# Patient Record
Sex: Female | Born: 1975 | Race: White | Hispanic: No | Marital: Single | Smoking: Former smoker
Health system: Southern US, Community
[De-identification: ages and names within clinical notes are randomized; demographics above are authoritative.]

## PROBLEM LIST (undated history)

## (undated) DIAGNOSIS — E039 Hypothyroidism, unspecified: Secondary | ICD-10-CM

## (undated) DIAGNOSIS — R0602 Shortness of breath: Secondary | ICD-10-CM

## (undated) DIAGNOSIS — F419 Anxiety disorder, unspecified: Secondary | ICD-10-CM

## (undated) DIAGNOSIS — I10 Essential (primary) hypertension: Secondary | ICD-10-CM

## (undated) HISTORY — PX: NO PAST SURGERIES: SHX2092

---

## 2001-05-25 ENCOUNTER — Other Ambulatory Visit: Payer: Self-pay | Admitting: Physician Assistant

## 2002-02-22 ENCOUNTER — Other Ambulatory Visit: Admission: RE | Admit: 2002-02-22 | Discharge: 2002-02-22 | Payer: Self-pay | Admitting: Obstetrics & Gynecology

## 2003-04-25 ENCOUNTER — Other Ambulatory Visit: Admission: RE | Admit: 2003-04-25 | Discharge: 2003-04-25 | Payer: Self-pay | Admitting: Obstetrics & Gynecology

## 2012-11-04 ENCOUNTER — Other Ambulatory Visit: Payer: Self-pay | Admitting: Endocrinology

## 2012-11-04 DIAGNOSIS — E041 Nontoxic single thyroid nodule: Secondary | ICD-10-CM

## 2012-11-06 ENCOUNTER — Ambulatory Visit
Admission: RE | Admit: 2012-11-06 | Discharge: 2012-11-06 | Disposition: A | Discharge status: Home / Self Care | Payer: 59 | Source: Ambulatory Visit | Attending: Endocrinology | Admitting: Endocrinology

## 2012-11-06 DIAGNOSIS — E041 Nontoxic single thyroid nodule: Secondary | ICD-10-CM

## 2012-11-09 ENCOUNTER — Other Ambulatory Visit: Payer: Self-pay | Admitting: Endocrinology

## 2012-11-09 DIAGNOSIS — E041 Nontoxic single thyroid nodule: Secondary | ICD-10-CM

## 2012-11-12 ENCOUNTER — Other Ambulatory Visit (HOSPITAL_COMMUNITY)
Admission: RE | Admit: 2012-11-12 | Discharge: 2012-11-12 | Disposition: A | Discharge status: Home / Self Care | Payer: 59 | Source: Ambulatory Visit | Attending: Interventional Radiology | Admitting: Interventional Radiology

## 2012-11-12 ENCOUNTER — Ambulatory Visit
Admission: RE | Admit: 2012-11-12 | Discharge: 2012-11-12 | Disposition: A | Discharge status: Home / Self Care | Payer: 59 | Source: Ambulatory Visit | Attending: Endocrinology | Admitting: Endocrinology

## 2012-11-12 ENCOUNTER — Other Ambulatory Visit: Payer: Self-pay

## 2012-11-12 DIAGNOSIS — E049 Nontoxic goiter, unspecified: Secondary | ICD-10-CM | POA: Insufficient documentation

## 2012-11-12 DIAGNOSIS — E041 Nontoxic single thyroid nodule: Secondary | ICD-10-CM

## 2012-11-24 ENCOUNTER — Other Ambulatory Visit: Payer: Self-pay | Admitting: Endocrinology

## 2012-11-24 DIAGNOSIS — E041 Nontoxic single thyroid nodule: Secondary | ICD-10-CM

## 2012-12-28 ENCOUNTER — Ambulatory Visit (HOSPITAL_BASED_OUTPATIENT_CLINIC_OR_DEPARTMENT_OTHER): Payer: 59 | Admitting: Genetic Counselor

## 2012-12-28 ENCOUNTER — Other Ambulatory Visit: Payer: 59

## 2012-12-28 DIAGNOSIS — IMO0002 Reserved for concepts with insufficient information to code with codable children: Secondary | ICD-10-CM

## 2012-12-28 DIAGNOSIS — Z801 Family history of malignant neoplasm of trachea, bronchus and lung: Secondary | ICD-10-CM

## 2012-12-28 DIAGNOSIS — Z803 Family history of malignant neoplasm of breast: Secondary | ICD-10-CM

## 2012-12-28 DIAGNOSIS — Z808 Family history of malignant neoplasm of other organs or systems: Secondary | ICD-10-CM

## 2012-12-29 ENCOUNTER — Encounter: Payer: Self-pay | Admitting: Genetic Counselor

## 2012-12-29 NOTE — Progress Notes (Signed)
Dr.  Viviann Spare requested a consultation for genetic counseling and risk assessment for Laura Kaiser, a 37 y.o. female, for discussion of her family history of breast, bone and lung cancer. She presents to clinic today to discuss the possibility of a genetic predisposition to cancer, and to further clarify her risks, as well as her family members' risks for cancer.   HISTORY OF PRESENT ILLNESS: Laura Kaiser is a 37 y.o. female with no personal history of cancer.    History reviewed. No pertinent past medical history.  History reviewed. No pertinent past surgical history.  History  Substance Use Topics  . Smoking status: Former Smoker -- 2 years  . Smokeless tobacco: Not on file  . Alcohol Use: Yes     Comment: rarely    REPRODUCTIVE HISTORY AND PERSONAL RISK ASSESSMENT FACTORS: Menarche was at age 84.   Premenopausal Uterus Intact: Yes Ovaries Intact: Yes [id number] , first live birth at age N/A  She has not previously undergone treatment for infertility.   Never used OCPs   She has used HRT in the past.  Is currently on Promera  FAMILY HISTORY:  We obtained a detailed, 4-generation family history.  Significant diagnoses are listed below: Family History  Problem Relation Age of Onset  . Breast cancer Mother 80  . Breast cancer Maternal Grandmother 76    also dx in her 45s and again at 69  . Bone cancer Maternal Grandfather   . Lung cancer Paternal Grandfather   . Breast cancer Cousin 59    maternal cousin  The patient's mother was diagnosed with breast cancer at age 56.  She has a brother and a sister who are healthy, but the brother's daughter was diagnosed with breast cancer at age 56.  The patient's maternal grandmother was diagnosed with breast cancer at ages 29, in her 44s and again at 52.  Her grandfather was diagnosed with bone cancer, and her paternal grandfather, who was a smoker, was diagnosed with lung cancer adn died at 37.  There is other reported cancer  history on either side of the family.  Patient's maternal ancestors are of [place] and [place] descent, and paternal ancestors are of [place] and [place] descent. There is no reported Ashkenazi Jewish ancestry. There is no  known consanguinity.  GENETIC COUNSELING RISK ASSESSMENT, DISCUSSION, AND SUGGESTED FOLLOW UP: We reviewed the natural history and genetic etiology of sporadic, familial and hereditary cancer syndromes.  About 5-10% of breast cancer is hereditary.  Of this, about 85% is the result of a BRCA1 or BRCA2 mutation.  We reviewed the red flags of hereditary cancer syndromes and the dominant inheritance patterns.  If the BRCA testing is negative, we discussed that we could be testing for the wrong gene.  We discussed gene panels, and that several cancer genes that are associated with different cancers can be tested at the same time. We discussed that testing someone who has not had cancer is not the most informative for the family.  If the patient is negative, her mother and especially her cousin, should get genetic testing.   The patient's family history of breast cancer is suggestive of the following possible diagnosis: hereditary cancer syndrome  We discussed that identification of a hereditary cancer syndrome may help her care providers tailor the patients medical management. If a mutation indicating a hereditary cancer syndrome is detected in this case, the [employer] recommendations would include increased cancer surveillance and possible prophylactic surgery.  If a mutation is detected, the patient will be referred back to the referring provider and to any additional appropriate care providers to discuss the relevant options.   If a mutation is not found in the patient, cancer surveillance options would be discussed for the patient according to the appropriate standard National Comprehensive Cancer Network and American Cancer Society guidelines, with consideration  of their personal and family history risk factors. In this case, the patient will be referred back to their care providers for discussions of management.   In order to estimate her chance of having a BRCA mutation, we used statistical models (Penn II and tyrer Cusik) and laboratory data that take into account her personal medical history, family history and ancestry.  Because each model is different, there can be a lot of variability in the risks they give.  Therefore, these numbers must be considered a rough range and not a precise risk of having a BRCA mutation.  These models estimate that she has approximately a 4.74-13% chance of having a mutation. Based on this assessment of her family and personal history, genetic testing is recommended.  Based on the patient's personal and family history, statistical models (Tyrer Cusik)  and literature data were used to estimate her risk of developing breast cancer. These estimate her lifetime risk of developing breast cancer to be approximately 39.5%. This estimation does not take into account any genetic testing results.   After considering the risks, benefits, and limitations, the patient provided informed consent for  the following  testing: Breast/Ovarian Cancer Panle through [facility].   Per the patient's request, we will contact her by telephone to discuss these results. A follow up genetic counseling visit will be scheduled if indicated.  The patient was seen for a total of 60 minutes, greater than 50% of which was spent face-to-face counseling.  This plan is being carried out per Dr. Viviann Spare recommendations.  This note will also be sent to the referring provider via the electronic medical record. The patient will be supplied with a summary of this genetic counseling discussion as well as educational information on the discussed hereditary cancer syndromes following the conclusion of their visit.   Patient was discussed with Dr. Drue Second.   _______________________________________________________________________ For Office Staff:  Number of people involved in session: 3 Was an Intern/ student involved with case: yes }

## 2013-01-28 ENCOUNTER — Other Ambulatory Visit: Payer: 59 | Admitting: Lab

## 2013-02-12 ENCOUNTER — Encounter (HOSPITAL_COMMUNITY): Payer: Self-pay | Admitting: Pharmacist

## 2013-02-12 ENCOUNTER — Other Ambulatory Visit: Payer: Self-pay | Admitting: Obstetrics

## 2013-02-16 ENCOUNTER — Encounter (HOSPITAL_COMMUNITY)
Admission: RE | Admit: 2013-02-16 | Discharge: 2013-02-16 | Disposition: A | Discharge status: Home / Self Care | Payer: 59 | Source: Ambulatory Visit | Attending: Obstetrics | Admitting: Obstetrics

## 2013-02-16 ENCOUNTER — Encounter (HOSPITAL_COMMUNITY): Payer: Self-pay

## 2013-02-16 HISTORY — DX: Shortness of breath: R06.02

## 2013-02-16 HISTORY — DX: Essential (primary) hypertension: I10

## 2013-02-16 HISTORY — DX: Anxiety disorder, unspecified: F41.9

## 2013-02-16 HISTORY — DX: Hypothyroidism, unspecified: E03.9

## 2013-02-16 LAB — CBC
Hemoglobin: 11.3 g/dL — ABNORMAL LOW (ref 12.0–15.0)
MCHC: 33 g/dL (ref 30.0–36.0)
Platelets: 213 10*3/uL (ref 150–400)
RBC: 4.19 MIL/uL (ref 3.87–5.11)

## 2013-02-16 LAB — BASIC METABOLIC PANEL
CO2: 28 mEq/L (ref 19–32)
GFR calc non Af Amer: >90 mL/min (ref >90)
Glucose, Bld: 98 mg/dL (ref 70–99)
Potassium: 3.7 mEq/L (ref 3.5–5.1)
Sodium: 138 mEq/L (ref 135–145)

## 2013-02-16 NOTE — Patient Instructions (Signed)
Your procedure is scheduled on:02/18/13  Enter through the Main Entrance at :0945 am Pick up desk phone and dial [phone] and inform us of your arrival.  Please call [phone] if you have any problems the morning of surgery.  Remember: Do not eat after midnight:WED   Take these meds the morning of surgery with a sip of water:Thyroid and BP pill   DO NOT wear jewelry, eye make-up, lipstick,body lotion, or dark fingernail polish.  Patients discharged on the day of surgery will not be allowed to drive home.

## 2013-02-17 MED ORDER — DOXYCYCLINE HYCLATE 100 MG IV SOLR
100.0000 mg | Freq: Two times a day (BID) | INTRAVENOUS | Status: DC
  Administered 2013-02-18: 100 mg via INTRAVENOUS
  Filled 2013-02-17 (×4): qty 100

## 2013-02-18 ENCOUNTER — Encounter (HOSPITAL_COMMUNITY): Payer: Self-pay | Admitting: Anesthesiology

## 2013-02-18 ENCOUNTER — Ambulatory Visit (HOSPITAL_COMMUNITY): Payer: 59 | Admitting: Anesthesiology

## 2013-02-18 ENCOUNTER — Encounter (HOSPITAL_COMMUNITY)
Admission: RE | Disposition: A | Discharge status: Home / Self Care | Payer: Self-pay | Source: Ambulatory Visit | Attending: Obstetrics

## 2013-02-18 ENCOUNTER — Ambulatory Visit (HOSPITAL_COMMUNITY)
Admission: RE | Admit: 2013-02-18 | Discharge: 2013-02-18 | Disposition: A | Discharge status: Home / Self Care | Payer: 59 | Source: Ambulatory Visit | Attending: Obstetrics | Admitting: Obstetrics

## 2013-02-18 DIAGNOSIS — N920 Excessive and frequent menstruation with regular cycle: Secondary | ICD-10-CM | POA: Insufficient documentation

## 2013-02-18 DIAGNOSIS — N8501 Benign endometrial hyperplasia: Secondary | ICD-10-CM | POA: Insufficient documentation

## 2013-02-18 DIAGNOSIS — R9389 Abnormal findings on diagnostic imaging of other specified body structures: Secondary | ICD-10-CM | POA: Insufficient documentation

## 2013-02-18 HISTORY — PX: HYSTEROSCOPY W/D&C: SHX1775

## 2013-02-18 LAB — URINALYSIS, ROUTINE W REFLEX MICROSCOPIC
Glucose, UA: NEGATIVE mg/dL
Nitrite: NEGATIVE
Protein, ur: NEGATIVE mg/dL
Urobilinogen, UA: 0.2 mg/dL (ref 0.0–1.0)

## 2013-02-18 LAB — PREGNANCY, URINE: Preg Test, Ur: NEGATIVE

## 2013-02-18 LAB — URINE MICROSCOPIC-ADD ON

## 2013-02-18 SURGERY — DILATATION AND CURETTAGE /HYSTEROSCOPY
Anesthesia: General | Site: Vagina | Wound class: Clean Contaminated

## 2013-02-18 MED ORDER — IBUPROFEN 200 MG PO TABS: 600.0000 mg | ORAL_TABLET | Freq: Four times a day (QID) | ORAL | Status: DC | PRN

## 2013-02-18 MED ORDER — LIDOCAINE HCL (CARDIAC) 20 MG/ML IV SOLN
INTRAVENOUS | Status: AC
  Filled 2013-02-18: qty 5

## 2013-02-18 MED ORDER — SILVER NITRATE-POT NITRATE 75-25 % EX MISC
CUTANEOUS | Status: AC
  Filled 2013-02-18: qty 1

## 2013-02-18 MED ORDER — OXYCODONE-ACETAMINOPHEN 5-325 MG PO TABS
2.0000 | ORAL_TABLET | ORAL | Status: DC | PRN
Start: 2013-02-18 — End: 2013-02-18

## 2013-02-18 MED ORDER — PROMETHAZINE HCL 25 MG/ML IJ SOLN: 6.2500 mg | INTRAMUSCULAR | Status: DC | PRN

## 2013-02-18 MED ORDER — KETOROLAC TROMETHAMINE 30 MG/ML IJ SOLN
INTRAMUSCULAR | Status: AC
  Filled 2013-02-18: qty 1

## 2013-02-18 MED ORDER — BUPIVACAINE HCL (PF) 0.25 % IJ SOLN
INTRAMUSCULAR | Status: AC
  Filled 2013-02-18: qty 30

## 2013-02-18 MED ORDER — KETOROLAC TROMETHAMINE 30 MG/ML IJ SOLN: 15.0000 mg | Freq: Once | INTRAMUSCULAR | Status: DC | PRN

## 2013-02-18 MED ORDER — FENTANYL CITRATE 0.05 MG/ML IJ SOLN
INTRAMUSCULAR | Status: DC | PRN
  Administered 2013-02-18: 100 ug via INTRAVENOUS

## 2013-02-18 MED ORDER — FENTANYL CITRATE 0.05 MG/ML IJ SOLN
INTRAMUSCULAR | Status: AC
  Filled 2013-02-18: qty 2

## 2013-02-18 MED ORDER — BACITRACIN-NEOMYCIN-POLYMYXIN 400-5-5000 EX OINT
TOPICAL_OINTMENT | CUTANEOUS | Status: AC
  Filled 2013-02-18: qty 1

## 2013-02-18 MED ORDER — PROPOFOL 10 MG/ML IV EMUL
INTRAVENOUS | Status: DC | PRN
  Administered 2013-02-18: 180 mg via INTRAVENOUS

## 2013-02-18 MED ORDER — MIDAZOLAM HCL 5 MG/5ML IJ SOLN
INTRAMUSCULAR | Status: DC | PRN
  Administered 2013-02-18: 2 mg via INTRAVENOUS

## 2013-02-18 MED ORDER — LIDOCAINE HCL (CARDIAC) 20 MG/ML IV SOLN
INTRAVENOUS | Status: DC | PRN
  Administered 2013-02-18: 50 mg via INTRAVENOUS

## 2013-02-18 MED ORDER — SODIUM CHLORIDE 0.9 % IR SOLN
Status: DC | PRN
  Administered 2013-02-18: 3000 mL

## 2013-02-18 MED ORDER — KETOROLAC TROMETHAMINE 30 MG/ML IJ SOLN
INTRAMUSCULAR | Status: DC | PRN
  Administered 2013-02-18: 30 mg via INTRAVENOUS

## 2013-02-18 MED ORDER — CHLOROPROCAINE HCL 1 % IJ SOLN
INTRAMUSCULAR | Status: DC | PRN
  Administered 2013-02-18: 20 mL

## 2013-02-18 MED ORDER — MEPERIDINE HCL 25 MG/ML IJ SOLN: 6.2500 mg | INTRAMUSCULAR | Status: DC | PRN

## 2013-02-18 MED ORDER — ONDANSETRON HCL 4 MG/2ML IJ SOLN
INTRAMUSCULAR | Status: DC | PRN
  Administered 2013-02-18: 4 mg via INTRAVENOUS

## 2013-02-18 MED ORDER — PROPOFOL 10 MG/ML IV EMUL
INTRAVENOUS | Status: AC
  Filled 2013-02-18: qty 20

## 2013-02-18 MED ORDER — PHENYLEPHRINE 40 MCG/ML (10ML) SYRINGE FOR IV PUSH (FOR BLOOD PRESSURE SUPPORT)
PREFILLED_SYRINGE | INTRAVENOUS | Status: AC
  Filled 2013-02-18: qty 5

## 2013-02-18 MED ORDER — DEXAMETHASONE SODIUM PHOSPHATE 10 MG/ML IJ SOLN
INTRAMUSCULAR | Status: AC
  Filled 2013-02-18: qty 1

## 2013-02-18 MED ORDER — GLYCINE 1.5 % IR SOLN
Status: DC | PRN
  Administered 2013-02-18: 3000 mL

## 2013-02-18 MED ORDER — PHENYLEPHRINE HCL 10 MG/ML IJ SOLN
INTRAMUSCULAR | Status: DC | PRN
  Administered 2013-02-18: 80 ug via INTRAVENOUS
  Administered 2013-02-18 (×2): 40 ug via INTRAVENOUS

## 2013-02-18 MED ORDER — DEXAMETHASONE SODIUM PHOSPHATE 10 MG/ML IJ SOLN
INTRAMUSCULAR | Status: DC | PRN
  Administered 2013-02-18: 10 mg via INTRAVENOUS

## 2013-02-18 MED ORDER — LACTATED RINGERS IV SOLN
INTRAVENOUS | Status: DC
Start: 2013-02-18 — End: 2013-02-18
  Administered 2013-02-18 (×2): via INTRAVENOUS
  Administered 2013-02-18: 50 mL/h via INTRAVENOUS
  Administered 2013-02-18: 10:00:00 via INTRAVENOUS

## 2013-02-18 MED ORDER — CHLOROPROCAINE HCL 1 % IJ SOLN
INTRAMUSCULAR | Status: AC
  Filled 2013-02-18: qty 30

## 2013-02-18 MED ORDER — OXYCODONE-ACETAMINOPHEN 5-325 MG PO TABS: 2.0000 | ORAL_TABLET | Freq: Four times a day (QID) | ORAL | Status: DC | PRN

## 2013-02-18 MED ORDER — FENTANYL CITRATE 0.05 MG/ML IJ SOLN: 25.0000 ug | INTRAMUSCULAR | Status: DC | PRN

## 2013-02-18 MED ORDER — MIDAZOLAM HCL 2 MG/2ML IJ SOLN
INTRAMUSCULAR | Status: AC
  Filled 2013-02-18: qty 2

## 2013-02-18 MED ORDER — MIDAZOLAM HCL 2 MG/2ML IJ SOLN: 0.5000 mg | Freq: Once | INTRAMUSCULAR | Status: DC | PRN

## 2013-02-18 MED ORDER — ONDANSETRON HCL 4 MG/2ML IJ SOLN
INTRAMUSCULAR | Status: AC
  Filled 2013-02-18: qty 2

## 2013-02-18 SURGICAL SUPPLY — 16 items
CANISTER SUCTION 2500CC (MISCELLANEOUS) ×2 IMPLANT
CATH ROBINSON RED A/P 16FR (CATHETERS) ×2 IMPLANT
CLOTH BEACON ORANGE TIMEOUT ST (SAFETY) ×2 IMPLANT
CONTAINER PREFILL 10% NBF 60ML (FORM) ×4 IMPLANT
DRESSING TELFA 8X3 (GAUZE/BANDAGES/DRESSINGS) ×2 IMPLANT
ELECT REM PT RETURN 9FT ADLT (ELECTROSURGICAL) ×2
ELECTRODE REM PT RTRN 9FT ADLT (ELECTROSURGICAL) ×1 IMPLANT
GLOVE BIO SURGEON STRL SZ 6.5 (GLOVE) ×2 IMPLANT
GLOVE BIOGEL PI IND STRL 7.0 (GLOVE) ×1 IMPLANT
GLOVE BIOGEL PI INDICATOR 7.0 (GLOVE) ×1
GOWN STRL REIN XL XLG (GOWN DISPOSABLE) ×4 IMPLANT
LOOP ANGLED CUTTING 22FR (CUTTING LOOP) IMPLANT
PACK HYSTEROSCOPY LF (CUSTOM PROCEDURE TRAY) ×2 IMPLANT
PAD OB MATERNITY 4.3X12.25 (PERSONAL CARE ITEMS) ×2 IMPLANT
TOWEL OR 17X24 6PK STRL BLUE (TOWEL DISPOSABLE) ×4 IMPLANT
WATER STERILE IRR 1000ML POUR (IV SOLUTION) ×2 IMPLANT

## 2013-02-18 NOTE — Op Note (Signed)
02/18/2013  1:30 PM  PATIENT:  Laura Kaiser  37 y.o. female  PRE-OPERATIVE DIAGNOSIS:  Endometrial Hyperplasia, Menorrhagia  [id number]  POST-OPERATIVE DIAGNOSIS:  endometrial hyperplasia, complex, menorrhagia  PROCEDURE:  Procedure(s): DILATATION AND CURETTAGE /HYSTEROSCOPY (N/A)  SURGEON:  Surgeon(s) and Role:    Tresa Endo A. Ernestina Penna, MD - Primary  PHYSICIAN ASSISTANT: none  ASSISTANTS: none   ANESTHESIA:   local and general Abx: 100mg  IV doxycycline Findings: b/l ostia, thickened endometrium, hemostasis post-procedure, 11 cm uterine sound.   EBL:  Total I/O In: 800 [I.V.:800] Out: 250 [Urine:250]  BLOOD ADMINISTERED:none  DRAINS: none   LOCAL MEDICATIONS USED:  MARCAINE     SPECIMEN:  Source of Specimen:  endometrial currettings  DISPOSITION OF SPECIMEN:  PATHOLOGY  COUNTS:  YES  TOURNIQUET:  * No tourniquets in log *  DICTATION: .Note written in EPIC  PLAN OF CARE: Discharge to home after PACU  PATIENT DISPOSITION:  PACU - hemodynamically stable.   Delay start of Pharmacological VTE agent (>24hrs) due to surgical blood loss or risk of bleeding: yes  Indications: complex hyperplasia with continued bleeding, endometrial thickening   After informed consent including discussion of risks of bleeding, infection,anasthetic complications, the patient was taken to the operating room where general anesthesia was initiated without difficulty. She was prepped and draped in normal sterile fashion in the dorsal supine lithotomy position. A straight cath was done for 250cc urine. A bimanual examination was done to assess the size and position of the uterus. A speculum was placed in the vagina and 20 cc 1/2% marcaine was used at 5 and  7 o'clock in the cervico-vaginal junction. Uterus sounded to 11 mm.  A single-tooth tenaculum was used to grasp the anterior lip of the cervix and the small hysteroscope was inserted into the uterus. Evaluation of the cavity revealed nl ostea b/l and  thickened endometrium throughout. No focal defect noted and no cancerous mass. Sharp currettage was carried out with removal of significant hypertrophic endometrium. Repeat hysteroscopy was done to confirm no uterine perforation and no masses. Fluffy endometrium noted, no bleeding.  The hysteroscope was then removed. Tenaculum was removed. The tenaculum site was hemostatic and the case was terminated.

## 2013-02-18 NOTE — Brief Op Note (Signed)
02/18/2013  1:30 PM  PATIENT:  Laura Kaiser  37 y.o. female  PRE-OPERATIVE DIAGNOSIS:  Endometrial Hyperplasia, Menorrhagia  [id number]  POST-OPERATIVE DIAGNOSIS:  endometrial hyperplasia, complex, menorrhagia  PROCEDURE:  Procedure(s): DILATATION AND CURETTAGE /HYSTEROSCOPY (N/A)  SURGEON:  Surgeon(s) and Role:    Tresa Endo A. Ernestina Penna, MD - Primary  PHYSICIAN ASSISTANT: none  ASSISTANTS: none   ANESTHESIA:   local and general  EBL:  Total I/O In: 800 [I.V.:800] Out: 250 [Urine:250]  BLOOD ADMINISTERED:none  DRAINS: none   LOCAL MEDICATIONS USED:  MARCAINE     SPECIMEN:  Source of Specimen:  endometrial currettings  DISPOSITION OF SPECIMEN:  PATHOLOGY  COUNTS:  YES  TOURNIQUET:  * No tourniquets in log *  DICTATION: .Note written in EPIC  PLAN OF CARE: Discharge to home after PACU  PATIENT DISPOSITION:  PACU - hemodynamically stable.   Delay start of Pharmacological VTE agent (>24hrs) due to surgical blood loss or risk of bleeding: yes

## 2013-02-18 NOTE — Anesthesia Preprocedure Evaluation (Signed)
Anesthesia Evaluation  Patient identified by MRN, date of birth, ID band Patient awake    Reviewed: Allergy & Precautions, H&P , Patient's Chart, lab work & pertinent test results, reviewed documented beta blocker date and time   History of Anesthesia Complications Negative for: history of anesthetic complications  Airway Mallampati: III TM Distance: >3 FB Neck ROM: full    Dental no notable dental hx.    Pulmonary neg pulmonary ROS, shortness of breath,  breath sounds clear to auscultation  Pulmonary exam normal       Cardiovascular Exercise Tolerance: Good hypertension, negative cardio ROS  Rhythm:regular Rate:Normal     Neuro/Psych PSYCHIATRIC DISORDERS Anxiety negative neurological ROS  negative psych ROS   GI/Hepatic negative GI ROS, Neg liver ROS,   Endo/Other  negative endocrine ROSHypothyroidism Morbid obesity  Renal/GU negative Renal ROS     Musculoskeletal   Abdominal   Peds  Hematology negative hematology ROS (+)   Anesthesia Other Findings   Reproductive/Obstetrics negative OB ROS                           Anesthesia Physical Anesthesia Plan  ASA: III  Anesthesia Plan: General LMA   Post-op Pain Management:    Induction:   Airway Management Planned:   Additional Equipment:   Intra-op Plan:   Post-operative Plan:   Informed Consent: I have reviewed the patients History and Physical, chart, labs and discussed the procedure including the risks, benefits and alternatives for the proposed anesthesia with the patient or authorized representative who has indicated his/her understanding and acceptance.   Dental Advisory Given  Plan Discussed with: CRNA, Surgeon and Anesthesiologist  Anesthesia Plan Comments:         Anesthesia Quick Evaluation

## 2013-02-18 NOTE — Transfer of Care (Signed)
Immediate Anesthesia Transfer of Care Note  Patient: Laura Kaiser  Procedure(s) Performed: Procedure(s): DILATATION AND CURETTAGE /HYSTEROSCOPY (N/A)  Patient Location: PACU  Anesthesia Type:General  Level of Consciousness: awake  Airway & Oxygen Therapy: Patient Spontanous Breathing  Post-op Assessment: Report given to PACU RN  Post vital signs: stable  Filed Vitals:   02/18/13 1003  BP: 136/90  Pulse: 120  Temp: 36.7 C  Resp: 18    Complications: No apparent anesthesia complications

## 2013-02-18 NOTE — H&P (Signed)
H&P update  Met w/ pt, no new medical condition, allergies, medications.  See scanned H&P. In brief, obese pt with persistent complex hyperplasia and bleeding on progestin. For D&C, hysteroscopy. Bleeding infection risks d/w pt who agrees to proceed. Pt has opted against post-op progesterone suppression despite multiple recommendations.   Miria Cappelli A. 02/18/2013 12:41 PM

## 2013-02-18 NOTE — Anesthesia Postprocedure Evaluation (Signed)
Anesthesia Post Note  Patient: Laura Kaiser  Procedure(s) Performed: Procedure(s) (LRB): DILATATION AND CURETTAGE /HYSTEROSCOPY (N/A)  Anesthesia type: General  Patient location: PACU  Post pain: Pain level controlled  Post assessment: Post-op Vital signs reviewed  Last Vitals:  Filed Vitals:   02/18/13 1400  BP:   Pulse: 92  Temp:   Resp: 16    Post vital signs: Reviewed  Level of consciousness: sedated  Complications: No apparent anesthesia complications

## 2013-02-19 ENCOUNTER — Encounter (HOSPITAL_COMMUNITY): Payer: Self-pay | Admitting: Obstetrics

## 2013-03-25 ENCOUNTER — Telehealth: Payer: Self-pay | Admitting: Genetic Counselor

## 2013-03-25 NOTE — Telephone Encounter (Signed)
Revealed negative genetic testing on [facility] panel

## 2013-03-26 ENCOUNTER — Encounter: Payer: Self-pay | Admitting: Genetic Counselor

## 2013-05-24 ENCOUNTER — Ambulatory Visit
Admission: RE | Admit: 2013-05-24 | Discharge: 2013-05-24 | Disposition: A | Discharge status: Home / Self Care | Payer: 59 | Source: Ambulatory Visit | Attending: Endocrinology | Admitting: Endocrinology

## 2013-05-24 DIAGNOSIS — E041 Nontoxic single thyroid nodule: Secondary | ICD-10-CM

## 2013-06-01 ENCOUNTER — Other Ambulatory Visit: Payer: Self-pay | Admitting: Endocrinology

## 2013-06-01 DIAGNOSIS — E041 Nontoxic single thyroid nodule: Secondary | ICD-10-CM

## 2013-11-22 ENCOUNTER — Other Ambulatory Visit: Payer: 59

## 2014-01-24 ENCOUNTER — Ambulatory Visit
Admission: RE | Admit: 2014-01-24 | Discharge: 2014-01-24 | Disposition: A | Discharge status: Home / Self Care | Payer: 59 | Source: Ambulatory Visit | Attending: Endocrinology | Admitting: Endocrinology

## 2014-01-24 DIAGNOSIS — E041 Nontoxic single thyroid nodule: Secondary | ICD-10-CM

## 2014-03-22 ENCOUNTER — Other Ambulatory Visit: Payer: Self-pay | Admitting: Endocrinology

## 2014-03-22 DIAGNOSIS — E041 Nontoxic single thyroid nodule: Secondary | ICD-10-CM

## 2014-07-22 ENCOUNTER — Ambulatory Visit
Admission: RE | Admit: 2014-07-22 | Discharge: 2014-07-22 | Disposition: A | Discharge status: Home / Self Care | Payer: 59 | Source: Ambulatory Visit | Attending: Endocrinology | Admitting: Endocrinology

## 2014-07-22 DIAGNOSIS — E041 Nontoxic single thyroid nodule: Secondary | ICD-10-CM

## 2014-08-04 ENCOUNTER — Other Ambulatory Visit: Payer: Self-pay | Admitting: Endocrinology

## 2014-08-04 DIAGNOSIS — E041 Nontoxic single thyroid nodule: Secondary | ICD-10-CM

## 2015-02-18 ENCOUNTER — Encounter (HOSPITAL_COMMUNITY): Payer: Self-pay | Admitting: Emergency Medicine

## 2015-02-18 ENCOUNTER — Emergency Department (HOSPITAL_COMMUNITY)
Admission: EM | Admit: 2015-02-18 | Discharge: 2015-02-18 | Disposition: A | Discharge status: Home / Self Care | Payer: 59 | Attending: Emergency Medicine | Admitting: Emergency Medicine

## 2015-02-18 DIAGNOSIS — Z880 Allergy status to penicillin: Secondary | ICD-10-CM | POA: Diagnosis not present

## 2015-02-18 DIAGNOSIS — Z8659 Personal history of other mental and behavioral disorders: Secondary | ICD-10-CM | POA: Insufficient documentation

## 2015-02-18 DIAGNOSIS — Z7951 Long term (current) use of inhaled steroids: Secondary | ICD-10-CM | POA: Diagnosis not present

## 2015-02-18 DIAGNOSIS — Z8639 Personal history of other endocrine, nutritional and metabolic disease: Secondary | ICD-10-CM | POA: Diagnosis not present

## 2015-02-18 DIAGNOSIS — Z79899 Other long term (current) drug therapy: Secondary | ICD-10-CM | POA: Insufficient documentation

## 2015-02-18 DIAGNOSIS — I10 Essential (primary) hypertension: Secondary | ICD-10-CM | POA: Diagnosis not present

## 2015-02-18 DIAGNOSIS — R42 Dizziness and giddiness: Secondary | ICD-10-CM | POA: Insufficient documentation

## 2015-02-18 DIAGNOSIS — R1110 Vomiting, unspecified: Secondary | ICD-10-CM | POA: Diagnosis not present

## 2015-02-18 DIAGNOSIS — Z87891 Personal history of nicotine dependence: Secondary | ICD-10-CM | POA: Insufficient documentation

## 2015-02-18 DIAGNOSIS — R05 Cough: Secondary | ICD-10-CM | POA: Diagnosis not present

## 2015-02-18 LAB — CBC WITH DIFFERENTIAL/PLATELET
BASOS ABS: 0 10*3/uL (ref 0.0–0.1)
BASOS PCT: 0 % (ref 0–1)
EOS PCT: 2 % (ref 0–5)
Eosinophils Absolute: 0.2 10*3/uL (ref 0.0–0.7)
HEMATOCRIT: 43.4 % (ref 36.0–46.0)
Hemoglobin: 14.6 g/dL (ref 12.0–15.0)
Lymphocytes Relative: 24 % (ref 12–46)
Lymphs Abs: 2.2 10*3/uL (ref 0.7–4.0)
MCH: 28.2 pg (ref 26.0–34.0)
MCHC: 33.6 g/dL (ref 30.0–36.0)
MCV: 83.9 fL (ref 78.0–100.0)
MONO ABS: 0.5 10*3/uL (ref 0.1–1.0)
Monocytes Relative: 6 % (ref 3–12)
NEUTROS ABS: 6.2 10*3/uL (ref 1.7–7.7)
Neutrophils Relative %: 68 % (ref 43–77)
PLATELETS: 186 10*3/uL (ref 150–400)
RBC: 5.17 MIL/uL — ABNORMAL HIGH (ref 3.87–5.11)
RDW: 13.2 % (ref 11.5–15.5)
WBC: 9 10*3/uL (ref 4.0–10.5)

## 2015-02-18 LAB — COMPREHENSIVE METABOLIC PANEL
ALT: 29 U/L (ref 14–54)
AST: 28 U/L (ref 15–41)
Albumin: 4.9 g/dL (ref 3.5–5.0)
Alkaline Phosphatase: 44 U/L (ref 38–126)
Anion gap: 11 (ref 5–15)
BUN: 11 mg/dL (ref 6–20)
CALCIUM: 9.7 mg/dL (ref 8.9–10.3)
CO2: 24 mmol/L (ref 22–32)
Chloride: 104 mmol/L (ref 101–111)
Creatinine, Ser: 0.53 mg/dL (ref 0.44–1.00)
GFR calc Af Amer: >60 mL/min (ref >60)
GFR calc non Af Amer: >60 mL/min (ref >60)
Glucose, Bld: 95 mg/dL (ref 70–99)
Potassium: 3.5 mmol/L (ref 3.5–5.1)
SODIUM: 139 mmol/L (ref 135–145)
TOTAL PROTEIN: 7.8 g/dL (ref 6.5–8.1)
Total Bilirubin: 1.3 mg/dL — ABNORMAL HIGH (ref 0.3–1.2)

## 2015-02-18 MED ORDER — MECLIZINE HCL 25 MG PO TABS: 25.0000 mg | ORAL_TABLET | Freq: Three times a day (TID) | ORAL | Status: DC | PRN

## 2015-02-18 MED ORDER — ONDANSETRON HCL 4 MG PO TABS: 4.0000 mg | ORAL_TABLET | Freq: Four times a day (QID) | ORAL | Status: DC

## 2015-02-18 MED ORDER — ONDANSETRON 8 MG PO TBDP
8.0000 mg | ORAL_TABLET | Freq: Once | ORAL | Status: AC
  Administered 2015-02-18: 8 mg via ORAL
  Filled 2015-02-18: qty 1

## 2015-02-18 MED ORDER — MECLIZINE HCL 25 MG PO TABS
12.5000 mg | ORAL_TABLET | Freq: Once | ORAL | Status: AC
  Administered 2015-02-18: 12.5 mg via ORAL
  Filled 2015-02-18: qty 1

## 2015-02-18 NOTE — ED Notes (Addendum)
Pt from home c/o dizziness when she woke up Thursday. Pt reports having pressure in ears and emesis when dizzy. She denies pain. She also reports drainage nose and throat. Pt denies blurred vision or headaches

## 2015-02-18 NOTE — Discharge Instructions (Signed)

## 2015-02-18 NOTE — ED Provider Notes (Signed)
CSN: [id number]     Arrival date & time 02/18/15  1958 History   First MD Initiated Contact with Patient 02/18/15 2117     Chief Complaint  Patient presents with  . Dizziness  . Emesis     (Consider location/radiation/quality/duration/timing/severity/associated sxs/prior Treatment) Patient is a 39 y.o. female presenting with dizziness and vomiting. The history is provided by the patient. No language interpreter was used.  Dizziness Associated symptoms: vomiting   Associated symptoms: no chest pain and no shortness of breath   Emesis Associated symptoms: no sore throat   Laura Kaiser is a 39 y.o female with a history of anxiety, SOB, HTN, or hypothyroidism presents with dizziness that began on Thursday.  She states she gets dizzy on occasion but it usually resolves after a few minutes but this time it stayed for several hours.  She has had 9 episodes of vomiting today and states it is worse with movement and even turning her head slightly.  She also says it is worse when going from a sitting to standing position.  She states she has eustachian dysfunction and environmental allergies.  She takes Claritin and Flonase daily. She also states she gets fluid build up in her ears per her pcp. She states that her ears feel clogged. She denies any head injury. She denies loss of consciousness or light headedness.  She denies any weakness or headache.    Past Medical History  Diagnosis Date  . Anxiety     h/o panic attack 14 yrs ago  . Shortness of breath     on exertion  . Hypertension   . Hypothyroidism      nodules- no difficulty swallowing.    Past Surgical History  Procedure Laterality Date  . No past surgeries    . Hysteroscopy w/d&c N/A 02/18/2013    Procedure: DILATATION AND CURETTAGE /HYSTEROSCOPY;  Surgeon: Alphonsus SiasKelly A. Ernestina PennaFogleman, MD;  Location: [facility];  Service: Gynecology;  Laterality: N/A;   Family History  Problem Relation Age of Onset  . Breast cancer Mother [id number]  . Breast cancer  Maternal Grandmother [id number]    also dx in her 2580s and again at 5891  . Bone cancer Maternal Grandfather   . Lung cancer Paternal Grandfather   . Breast cancer Cousin [id number]    maternal cousin   History  Substance Use Topics  . Smoking status: Former Smoker -- 2 years  . Smokeless tobacco: Not on file  . Alcohol Use: Yes     Comment: rarely   OB History    No data available     Review of Systems  HENT: Negative for ear pain, postnasal drip, rhinorrhea, sinus pressure and sore throat.   Eyes: Negative for pain.  Respiratory: Positive for cough. Negative for shortness of breath.   Cardiovascular: Negative for chest pain.  Gastrointestinal: Positive for vomiting.  Neurological: Positive for dizziness. Negative for speech difficulty.  All other systems reviewed and are negative.     Allergies  Amoxicillin  Home Medications   Prior to Admission medications   Medication Sig Start Date End Date Taking? Authorizing Provider  Cholecalciferol (VITAMIN D) 2000 UNITS tablet Take 6,000 Units by mouth daily.   Yes Historical Provider, MD  fluticasone (FLONASE) 50 MCG/ACT nasal spray Place 2 sprays into both nostrils daily as needed for allergies or rhinitis.   Yes Historical Provider, MD  lisinopril-hydrochlorothiazide (PRINZIDE,ZESTORETIC) 10-12.5 MG per tablet Take 1 tablet by mouth daily.   Yes Historical Provider, MD  loratadine (  CLARITIN) 10 MG tablet Take 10 mg by mouth daily as needed for allergies.   Yes Historical Provider, MD  ibuprofen (ADVIL) 200 MG tablet Take 3 tablets (600 mg total) by mouth every 6 (six) hours as needed for pain. Patient not taking: Reported on 02/18/2015 02/18/13   Noland FordyceKelly Fogleman, MD  meclizine (ANTIVERT) 25 MG tablet Take 1 tablet (25 mg total) by mouth 3 (three) times daily as needed for dizziness. 02/18/15   Christyne Mccain Patel-Mills, PA-C  ondansetron (ZOFRAN) 4 MG tablet Take 1 tablet (4 mg total) by mouth every 6 (six) hours. 02/18/15   Mimie Goering Patel-Mills, PA-C   oxyCODONE-acetaminophen (ROXICET) 5-325 MG per tablet Take 2 tablets by mouth every 6 (six) hours as needed for pain. Patient not taking: Reported on 02/18/2015 02/18/13   Noland FordyceKelly Fogleman, MD   BP 139/91 mmHg  Pulse 76  Temp(Src) 97.3 F (36.3 C) (Oral)  Resp 14  Ht 5\' 9"  (1.753 m)  Wt 280 lb (127.007 kg)  BMI 41.33 kg/m2  SpO2 100%  LMP 02/13/2015 Physical Exam  Constitutional: She is oriented to person, place, and time. She appears well-developed and well-nourished.  HENT:  Head: Normocephalic and atraumatic.  Eyes: Conjunctivae and EOM are normal. Pupils are equal, round, and reactive to light. Right eye exhibits no nystagmus. Left eye exhibits no nystagmus.  Neck: Normal range of motion. Neck supple.  Cardiovascular: Normal rate, regular rhythm and normal heart sounds.   Pulmonary/Chest: Effort normal and breath sounds normal.  Abdominal: Soft. There is no tenderness.  Musculoskeletal: Normal range of motion.  Neurological: She is alert and oriented to person, place, and time. She has normal strength. No sensory deficit. GCS eye subscore is 4. GCS verbal subscore is 5. GCS motor subscore is 6.  Cranial nerves III-XII in tact.   Skin: Skin is warm and dry.  Nursing note and vitals reviewed.   ED Course  Procedures (including critical care time) Labs Review Labs Reviewed  CBC WITH DIFFERENTIAL/PLATELET - Abnormal; Notable for the following:    RBC 5.17 (*)    All other components within normal limits  COMPREHENSIVE METABOLIC PANEL - Abnormal; Notable for the following:    Total Bilirubin 1.3 (*)    All other components within normal limits    Imaging Review No results found.   EKG Interpretation None      MDM   Final diagnoses:  Dizziness  Patient presents for intermittent dizziness that she describes as the room spinning for the past 2 days. She has had this in the past but not for this long. She states moving her head and going from a seated to standing position  makes it worse.  She is not orthostatic. She has a normal neuro exam. I think her dizziness is related to her inner ear problems and environmental allergies.   Medications  ondansetron (ZOFRAN-ODT) disintegrating tablet 8 mg (8 mg Oral Given 02/18/15 2030)  meclizine (ANTIVERT) tablet 12.5 mg (12.5 mg Oral Given 02/18/15 2254)  I have given her zofran and antivert here. She has not had an vomiting in the ED. Her vitals are stable and her labs are normal. She can follow up with her pcp and agrees with the plan.       Catha GosselinHanna Patel-Mills, PA-C 02/19/15 0015  Tilden FossaElizabeth Rees, MD 02/19/15 1455

## 2015-06-28 ENCOUNTER — Ambulatory Visit
Admission: RE | Admit: 2015-06-28 | Discharge: 2015-06-28 | Disposition: A | Discharge status: Home / Self Care | Payer: 59 | Source: Ambulatory Visit | Attending: Endocrinology | Admitting: Endocrinology

## 2015-06-28 DIAGNOSIS — E041 Nontoxic single thyroid nodule: Secondary | ICD-10-CM

## 2015-07-05 ENCOUNTER — Other Ambulatory Visit: Payer: Self-pay | Admitting: Endocrinology

## 2015-07-05 DIAGNOSIS — E041 Nontoxic single thyroid nodule: Secondary | ICD-10-CM

## 2015-08-04 ENCOUNTER — Other Ambulatory Visit: Payer: 59

## 2015-09-18 ENCOUNTER — Other Ambulatory Visit: Payer: Self-pay | Admitting: Obstetrics

## 2015-09-19 ENCOUNTER — Other Ambulatory Visit: Payer: Self-pay

## 2015-09-19 ENCOUNTER — Encounter (HOSPITAL_COMMUNITY): Payer: Self-pay

## 2015-09-19 ENCOUNTER — Encounter (HOSPITAL_COMMUNITY)
Admission: RE | Admit: 2015-09-19 | Discharge: 2015-09-19 | Disposition: A | Discharge status: Home / Self Care | Payer: 59 | Source: Ambulatory Visit | Attending: Obstetrics | Admitting: Obstetrics

## 2015-09-19 DIAGNOSIS — N938 Other specified abnormal uterine and vaginal bleeding: Secondary | ICD-10-CM | POA: Diagnosis not present

## 2015-09-19 DIAGNOSIS — I10 Essential (primary) hypertension: Secondary | ICD-10-CM | POA: Diagnosis not present

## 2015-09-19 DIAGNOSIS — Z6841 Body Mass Index (BMI) 40.0 and over, adult: Secondary | ICD-10-CM | POA: Diagnosis not present

## 2015-09-19 DIAGNOSIS — N840 Polyp of corpus uteri: Secondary | ICD-10-CM | POA: Diagnosis not present

## 2015-09-19 LAB — CBC
HCT: 39.5 % (ref 36.0–46.0)
Hemoglobin: 13.4 g/dL (ref 12.0–15.0)
MCH: 27.9 pg (ref 26.0–34.0)
MCHC: 33.9 g/dL (ref 30.0–36.0)
MCV: 82.3 fL (ref 78.0–100.0)
Platelets: 177 10*3/uL (ref 150–400)
RBC: 4.8 MIL/uL (ref 3.87–5.11)
RDW: 13.4 % (ref 11.5–15.5)
WBC: 7.2 10*3/uL (ref 4.0–10.5)

## 2015-09-19 LAB — BASIC METABOLIC PANEL
ANION GAP: 7 (ref 5–15)
BUN: 12 mg/dL (ref 6–20)
CO2: 27 mmol/L (ref 22–32)
CREATININE: 0.67 mg/dL (ref 0.44–1.00)
Calcium: 9.4 mg/dL (ref 8.9–10.3)
Chloride: 102 mmol/L (ref 101–111)
Glucose, Bld: 95 mg/dL (ref 65–99)
Potassium: 3.9 mmol/L (ref 3.5–5.1)
SODIUM: 136 mmol/L (ref 135–145)

## 2015-09-19 NOTE — Patient Instructions (Addendum)
   Your procedure is scheduled on: DEC 9 (FRIDAY)  [facility]: 9AM  Pick up the phone at the desk and dial [phone] and inform us of your arrival.  Please call this number if you have any problems the morning of surgery: [phone]  DO NOT EAT OR DRINK AFTER MIDNIGHT Thursday DEC 8  Take these medicines the morning of surgery with a SIP OF WATER: take blood pressue medication am of surgery  Do not wear jewelry, make-up, or FINGER nail polish No metal in your hair or on your body. Do not wear lotions, powders, perfumes.  You may wear deodorant.  Do not bring valuables to the hospital. Contacts, dentures or bridgework may not be worn into surgery.    Patients discharged on the day of surgery will not be allowed to drive home.

## 2015-09-21 ENCOUNTER — Other Ambulatory Visit: Payer: Self-pay | Admitting: Obstetrics

## 2015-09-21 DIAGNOSIS — Z1231 Encounter for screening mammogram for malignant neoplasm of breast: Secondary | ICD-10-CM

## 2015-09-21 MED ORDER — DOXYCYCLINE HYCLATE 100 MG IV SOLR
100.0000 mg | Freq: Once | INTRAVENOUS | Status: AC
  Administered 2015-09-22: 100 mg via INTRAVENOUS
  Filled 2015-09-21: qty 100

## 2015-09-21 MED ORDER — DEXTROSE 5 % IV SOLN
100.0000 mg | Freq: Two times a day (BID) | INTRAVENOUS | Status: DC
  Filled 2015-09-21 (×4): qty 100

## 2015-09-21 MED ORDER — DEXTROSE 5 % IV SOLN
100.0000 mg | Freq: Once | INTRAVENOUS | Status: DC
  Filled 2015-09-21: qty 100

## 2015-09-22 ENCOUNTER — Encounter (HOSPITAL_COMMUNITY)
Admission: RE | Disposition: A | Discharge status: Home / Self Care | Payer: Self-pay | Source: Ambulatory Visit | Attending: Obstetrics

## 2015-09-22 ENCOUNTER — Ambulatory Visit (HOSPITAL_COMMUNITY)
Admission: RE | Admit: 2015-09-22 | Discharge: 2015-09-22 | Disposition: A | Discharge status: Home / Self Care | Payer: 59 | Source: Ambulatory Visit | Attending: Obstetrics | Admitting: Obstetrics

## 2015-09-22 ENCOUNTER — Encounter (HOSPITAL_COMMUNITY): Payer: Self-pay | Admitting: Emergency Medicine

## 2015-09-22 ENCOUNTER — Ambulatory Visit (HOSPITAL_COMMUNITY): Payer: 59 | Admitting: Anesthesiology

## 2015-09-22 DIAGNOSIS — N938 Other specified abnormal uterine and vaginal bleeding: Secondary | ICD-10-CM | POA: Diagnosis not present

## 2015-09-22 DIAGNOSIS — I10 Essential (primary) hypertension: Secondary | ICD-10-CM | POA: Insufficient documentation

## 2015-09-22 DIAGNOSIS — Z6841 Body Mass Index (BMI) 40.0 and over, adult: Secondary | ICD-10-CM | POA: Insufficient documentation

## 2015-09-22 DIAGNOSIS — N840 Polyp of corpus uteri: Secondary | ICD-10-CM | POA: Insufficient documentation

## 2015-09-22 HISTORY — PX: DILITATION & CURRETTAGE/HYSTROSCOPY WITH VERSAPOINT RESECTION: SHX5571

## 2015-09-22 LAB — TYPE AND SCREEN
ABO/RH(D): A POS
Antibody Screen: NEGATIVE

## 2015-09-22 LAB — PREGNANCY, URINE: Preg Test, Ur: NEGATIVE

## 2015-09-22 LAB — ABO/RH: ABO/RH(D): A POS

## 2015-09-22 SURGERY — DILATATION & CURETTAGE/HYSTEROSCOPY WITH VERSAPOINT RESECTION
Anesthesia: General | Site: Vagina

## 2015-09-22 MED ORDER — PHENYLEPHRINE HCL 10 MG/ML IJ SOLN
INTRAMUSCULAR | Status: DC | PRN
  Administered 2015-09-22 (×5): 40 ug via INTRAVENOUS

## 2015-09-22 MED ORDER — SILVER NITRATE-POT NITRATE 75-25 % EX MISC
CUTANEOUS | Status: AC
  Filled 2015-09-22: qty 1

## 2015-09-22 MED ORDER — ONDANSETRON HCL 4 MG/2ML IJ SOLN
INTRAMUSCULAR | Status: DC | PRN
  Administered 2015-09-22: 4 mg via INTRAVENOUS

## 2015-09-22 MED ORDER — KETOROLAC TROMETHAMINE 30 MG/ML IJ SOLN
INTRAMUSCULAR | Status: AC
Start: 2015-09-22 — End: 2015-09-22
  Filled 2015-09-22: qty 1

## 2015-09-22 MED ORDER — LIDOCAINE HCL (CARDIAC) 20 MG/ML IV SOLN
INTRAVENOUS | Status: DC | PRN
  Administered 2015-09-22: 100 mg via INTRAVENOUS

## 2015-09-22 MED ORDER — CHLOROPROCAINE HCL 1 % IJ SOLN
INTRAMUSCULAR | Status: AC
  Filled 2015-09-22: qty 30

## 2015-09-22 MED ORDER — FENTANYL CITRATE (PF) 100 MCG/2ML IJ SOLN
INTRAMUSCULAR | Status: AC
Start: 2015-09-22 — End: 2015-09-22
  Filled 2015-09-22: qty 2

## 2015-09-22 MED ORDER — MIDAZOLAM HCL 5 MG/5ML IJ SOLN
INTRAMUSCULAR | Status: DC | PRN
  Administered 2015-09-22: 2 mg via INTRAVENOUS

## 2015-09-22 MED ORDER — ONDANSETRON HCL 4 MG/2ML IJ SOLN
INTRAMUSCULAR | Status: AC
  Filled 2015-09-22: qty 2

## 2015-09-22 MED ORDER — LACTATED RINGERS IV SOLN
INTRAVENOUS | Status: DC
  Administered 2015-09-22 (×2): via INTRAVENOUS

## 2015-09-22 MED ORDER — PHENYLEPHRINE 40 MCG/ML (10ML) SYRINGE FOR IV PUSH (FOR BLOOD PRESSURE SUPPORT)
PREFILLED_SYRINGE | INTRAVENOUS | Status: AC
  Filled 2015-09-22: qty 10

## 2015-09-22 MED ORDER — SCOPOLAMINE 1 MG/3DAYS TD PT72
MEDICATED_PATCH | TRANSDERMAL | Status: AC
  Administered 2015-09-22: 1.5 mg via TRANSDERMAL
  Filled 2015-09-22: qty 1

## 2015-09-22 MED ORDER — SODIUM CHLORIDE 0.9 % IR SOLN
Status: DC | PRN
  Administered 2015-09-22: 3000 mL

## 2015-09-22 MED ORDER — DEXAMETHASONE SODIUM PHOSPHATE 10 MG/ML IJ SOLN
INTRAMUSCULAR | Status: AC
  Filled 2015-09-22: qty 1

## 2015-09-22 MED ORDER — DEXAMETHASONE SODIUM PHOSPHATE 4 MG/ML IJ SOLN
INTRAMUSCULAR | Status: DC | PRN
  Administered 2015-09-22: 10 mg via INTRAVENOUS

## 2015-09-22 MED ORDER — MIDAZOLAM HCL 2 MG/2ML IJ SOLN
INTRAMUSCULAR | Status: AC
  Filled 2015-09-22: qty 2

## 2015-09-22 MED ORDER — PROPOFOL 10 MG/ML IV BOLUS
INTRAVENOUS | Status: AC
  Filled 2015-09-22: qty 20

## 2015-09-22 MED ORDER — LIDOCAINE HCL (CARDIAC) 20 MG/ML IV SOLN
INTRAVENOUS | Status: AC
  Filled 2015-09-22: qty 5

## 2015-09-22 MED ORDER — KETOROLAC TROMETHAMINE 30 MG/ML IJ SOLN
INTRAMUSCULAR | Status: DC | PRN
  Administered 2015-09-22: 30 mg via INTRAVENOUS

## 2015-09-22 MED ORDER — SCOPOLAMINE 1 MG/3DAYS TD PT72
1.0000 | MEDICATED_PATCH | Freq: Once | TRANSDERMAL | Status: DC
  Administered 2015-09-22: 1.5 mg via TRANSDERMAL

## 2015-09-22 MED ORDER — PROPOFOL 10 MG/ML IV BOLUS
INTRAVENOUS | Status: DC | PRN
  Administered 2015-09-22: 200 mg via INTRAVENOUS
  Administered 2015-09-22: 100 mg via INTRAVENOUS

## 2015-09-22 MED ORDER — BUPIVACAINE HCL 0.5 % IJ SOLN
INTRAMUSCULAR | Status: DC | PRN
Start: 2015-09-22 — End: 2015-09-22
  Administered 2015-09-22: 30 mL

## 2015-09-22 MED ORDER — FENTANYL CITRATE (PF) 100 MCG/2ML IJ SOLN
INTRAMUSCULAR | Status: AC
  Filled 2015-09-22: qty 2

## 2015-09-22 MED ORDER — OXYCODONE-ACETAMINOPHEN 5-325 MG PO TABS: 2.0000 | ORAL_TABLET | Freq: Four times a day (QID) | ORAL | Status: AC | PRN

## 2015-09-22 MED ORDER — FENTANYL CITRATE (PF) 100 MCG/2ML IJ SOLN
INTRAMUSCULAR | Status: DC | PRN
  Administered 2015-09-22 (×4): 50 ug via INTRAVENOUS

## 2015-09-22 MED ORDER — VASOPRESSIN 20 UNIT/ML IV SOLN
INTRAVENOUS | Status: AC
  Filled 2015-09-22: qty 1

## 2015-09-22 MED ORDER — BUPIVACAINE HCL (PF) 0.5 % IJ SOLN
INTRAMUSCULAR | Status: AC
  Filled 2015-09-22: qty 30

## 2015-09-22 SURGICAL SUPPLY — 20 items
CANISTER SUCT 3000ML (MISCELLANEOUS) ×3 IMPLANT
CATH ROBINSON RED A/P 16FR (CATHETERS) ×3 IMPLANT
CLOTH BEACON ORANGE TIMEOUT ST (SAFETY) ×3 IMPLANT
CONTAINER PREFILL 10% NBF 60ML (FORM) ×6 IMPLANT
ELECT REM PT RETURN 9FT ADLT (ELECTROSURGICAL) ×3
ELECTRODE REM PT RTRN 9FT ADLT (ELECTROSURGICAL) ×1 IMPLANT
ELECTRODE RT ANGLE VERSAPOINT (CUTTING LOOP) ×3 IMPLANT
GLOVE BIO SURGEON STRL SZ 6.5 (GLOVE) ×2 IMPLANT
GLOVE BIO SURGEONS STRL SZ 6.5 (GLOVE) ×1
GLOVE BIOGEL PI IND STRL 7.0 (GLOVE) ×2 IMPLANT
GLOVE BIOGEL PI INDICATOR 7.0 (GLOVE) ×4
GOWN STRL REUS W/TWL LRG LVL3 (GOWN DISPOSABLE) ×6 IMPLANT
LOOP ANGLED CUTTING 22FR (CUTTING LOOP) IMPLANT
PACK VAGINAL MINOR WOMEN LF (CUSTOM PROCEDURE TRAY) ×3 IMPLANT
PAD OB MATERNITY 4.3X12.25 (PERSONAL CARE ITEMS) ×3 IMPLANT
STENT BALLN UTERINE 4CM 6FR (STENTS) IMPLANT
TOWEL OR 17X24 6PK STRL BLUE (TOWEL DISPOSABLE) ×6 IMPLANT
TUBING AQUILEX INFLOW (TUBING) ×3 IMPLANT
TUBING AQUILEX OUTFLOW (TUBING) ×3 IMPLANT
WATER STERILE IRR 1000ML POUR (IV SOLUTION) ×3 IMPLANT

## 2015-09-22 NOTE — Discharge Instructions (Signed)

## 2015-09-22 NOTE — Transfer of Care (Signed)
Immediate Anesthesia Transfer of Care Note  Patient: Laura LittlerSusan E Kaiser  Procedure(s) Performed: Procedure(s): DILATATION & CURETTAGE/HYSTEROSCOPY POLYPECTOMY (N/A)  Patient Location: PACU  Anesthesia Type:General  Level of Consciousness: awake, alert  and oriented  Airway & Oxygen Therapy: Patient Spontanous Breathing and Patient connected to nasal cannula oxygen  Post-op Assessment: Report given to RN and Post -op Vital signs reviewed and stable  Post vital signs: Reviewed and stable  Last Vitals:  Filed Vitals:   09/22/15 0916  BP: 135/94  Pulse: 103  Temp: 36.6 C  Resp: 18    Complications: No apparent anesthesia complications

## 2015-09-22 NOTE — Anesthesia Postprocedure Evaluation (Signed)
Anesthesia Post Note  Patient: Laura LittlerSusan E Zawadzki  Procedure(s) Performed: Procedure(s) (LRB): DILATATION & CURETTAGE/HYSTEROSCOPY POLYPECTOMY (N/A)  Patient location during evaluation: PACU Anesthesia Type: General Level of consciousness: awake and alert Pain management: pain level controlled Vital Signs Assessment: post-procedure vital signs reviewed and stable Respiratory status: spontaneous breathing, nonlabored ventilation and respiratory function stable Cardiovascular status: blood pressure returned to baseline and stable Postop Assessment: no signs of nausea or vomiting Anesthetic complications: no    Last Vitals:  Filed Vitals:   09/22/15 1215 09/22/15 1230  BP: 114/79 116/78  Pulse: 86 87  Temp:    Resp: 16 14    Last Pain: There were no vitals filed for this visit.               Ioma Chismar A.

## 2015-09-22 NOTE — H&P (Signed)
CC: hysteroscopy, polypectomy D&C  HPI: 39 yo G0 oligomenorrheic pt with recent increase in DUB. Thickened endometrium, SIS with possible polyp and ebx with proliferative endometrium. Prior ebx with complex hyperplasia. Pt has not been using any hormonal suppression.   Past Medical History  Diagnosis Date  . Anxiety     h/o panic attack 14 yrs ago  . Shortness of breath     on exertion  . Hypertension   . Hypothyroidism      nodules- no difficulty swallowing.     Past Surgical History  Procedure Laterality Date  . No past surgeries    . Hysteroscopy w/d&c N/A 02/18/2013    Procedure: DILATATION AND CURETTAGE /HYSTEROSCOPY;  Surgeon: Alphonsus SiasKelly A. Ernestina PennaFogleman, MD;  Location: [facility];  Service: Gynecology;  Laterality: N/A;   All amoxicillin  PE: Filed Vitals:   09/22/15 0916  BP: 135/94  Pulse: 103  Temp: 97.8 F (36.6 C)  TempSrc: Oral  Resp: 18  SpO2: 100%   Gen: obese, pleasant, well appearing AbD: no masses, NT GU: def to OR LE: NT, no edema  CBC    Component Value Date/Time   WBC 7.2 09/19/2015 1250   RBC 4.80 09/19/2015 1250   HGB 13.4 09/19/2015 1250   HCT 39.5 09/19/2015 1250   PLT 177 09/19/2015 1250   MCV 82.3 09/19/2015 1250   MCH 27.9 09/19/2015 1250   MCHC 33.9 09/19/2015 1250   RDW 13.4 09/19/2015 1250   LYMPHSABS 2.2 02/18/2015 2042   MONOABS 0.5 02/18/2015 2042   EOSABS 0.2 02/18/2015 2042   BASOSABS 0.0 02/18/2015 2042     A/P: D&C, hysteroscopy, polypectomy. Plan post-op IUD insertion for improved long term endometrial suppression  Ethelwyn Gilbertson A. 09/22/2015 10:04 AM

## 2015-09-22 NOTE — Anesthesia Procedure Notes (Signed)
Procedure Name: LMA Insertion Date/Time: 09/22/2015 10:16 AM Performed by: Junious SilkGILBERT, Carmaleta Youngers Pre-anesthesia Checklist: Patient identified, Emergency Drugs available, Suction available, Patient being monitored and Timeout performed Patient Re-evaluated:Patient Re-evaluated prior to inductionOxygen Delivery Method: Circle system utilized Preoxygenation: Pre-oxygenation with 100% oxygen Intubation Type: IV induction Ventilation: Mask ventilation without difficulty LMA: LMA inserted LMA Size: 4.0 Number of attempts: 1 Placement Confirmation: positive ETCO2,  CO2 detector and breath sounds checked- equal and bilateral Tube secured with: Tape Dental Injury: Teeth and Oropharynx as per pre-operative assessment

## 2015-09-22 NOTE — Anesthesia Preprocedure Evaluation (Signed)
Anesthesia Evaluation  Patient identified by MRN, date of birth, ID band Patient awake    Reviewed: Allergy & Precautions, NPO status , Patient's Chart, lab work & pertinent test results  Airway Mallampati: III  TM Distance: >3 FB Neck ROM: Full    Dental no notable dental hx. (+) Teeth Intact   Pulmonary shortness of breath and with exertion, former smoker,    Pulmonary exam normal breath sounds clear to auscultation       Cardiovascular hypertension, Pt. on medications Normal cardiovascular exam Rhythm:Regular Rate:Normal     Neuro/Psych Anxiety negative neurological ROS     GI/Hepatic negative GI ROS, Neg liver ROS,   Endo/Other  Hypothyroidism Morbid obesity  Renal/GU negative Renal ROS  negative genitourinary   Musculoskeletal negative musculoskeletal ROS (+)   Abdominal (+) + obese,   Peds  Hematology negative hematology ROS (+)   Anesthesia Other Findings   Reproductive/Obstetrics Endometrial polyp Proliferative endometrium DUB                             Anesthesia Physical Anesthesia Plan  ASA: III  Anesthesia Plan: General   Post-op Pain Management:    Induction: Intravenous  Airway Management Planned: LMA  Additional Equipment:   Intra-op Plan:   Post-operative Plan: Extubation in OR  Informed Consent: I have reviewed the patients History and Physical, chart, labs and discussed the procedure including the risks, benefits and alternatives for the proposed anesthesia with the patient or authorized representative who has indicated his/her understanding and acceptance.   Dental advisory given  Plan Discussed with: CRNA, Anesthesiologist and Surgeon  Anesthesia Plan Comments:         Anesthesia Quick Evaluation

## 2015-09-22 NOTE — Op Note (Signed)
Preoperative diagnosis: Dysfunctional uterine bleeding, endometrial polyp  Postop diagnosis: as above.  Procedure: Hysteroscopic polypectomy, D&C Anesthesia General via LMA  Surgeon: Shea EvansVaishali Deonne Rooks, MD (for Dr Noland FordyceKelly Fogleman) Assistant: None  IV fluids: 1700 cc LR  Estimated blood loss 5 cc Urine output: straight catheter preop 30 cc Complications none  Condition stable  Disposition PACU  Pathology: Endometrial polyp and endometrial curettings  Findings:  One endometrial polyp and overall polypoidal endometrium, normal tubal ostii,  Normal cervical canal.    Procedure  Indication: Oligomenorrhea, past history of complex hyperplasia, recent normal office endometrial biopsy and sonohystogram noted endometrial polyp.  I was called in to perform surgery as Gyn, Dr Ernestina PennaFogleman was held up with another patient.   Patient was counseled by Dr Ernestina PennaFogleman on risks/ complications including infection, bleeding, damage to internal organs, she understood and agrees, gave informed written consent.  Patient was brought to the operating room with IV running and was under general anesthesia when I was called in. She received preop Doxycycline. She was in dorsal lithotomy position and prepped and draped. I carried out Time Out and confirmed patient, primary gynecologist and surgery and consent.  Bimanual exam revealed uterus to be anteverted and normal size. Speculum was placed and cervix was grasped with single-tooth tenaculum. Cervical block with 10 cc 1% Xylocaine with Vasopressin given. The uterus was sounded to 8 cm. Cervical os was dilated to 25 [place] dilator. Versapoint Hysteroscope was introduced in the uterine cavity under vision, using saline for irrigation.  Findings: One endometrial polyp and overall polypoidal endometrium, normal tubal ostii,  normal cervical canal.   Hysteroscopic polypectomy was performed with Versapoint loop and then a sharp gentle curettage performed, specimen sent to path.  Hysteroscope inserted again and cavity was empty, distended well, ostii seen and endometrium appeared thin.  All instruments were removed.   Saline fluid deficit: 360 cc.   All counts are correct x2. No complications. Patient was brought to the recovery room in stable condition.  Surgical findings discussed with patient's mother in the waiting room after introducing myself to her as Dr Elpidio EricFogleman's practice partner. Findings informed to Dr Ernestina PennaFogleman and OR pictures given.   Patient will be discharged home today. Follow up in 2 weeks with Dr Ernestina PennaFogleman.  V.Ennifer Harston, MD.

## 2015-09-25 ENCOUNTER — Encounter (HOSPITAL_COMMUNITY): Payer: Self-pay | Admitting: Obstetrics

## 2015-09-29 ENCOUNTER — Ambulatory Visit
Admission: RE | Admit: 2015-09-29 | Discharge: 2015-09-29 | Disposition: A | Discharge status: Home / Self Care | Payer: 59 | Source: Ambulatory Visit | Attending: Obstetrics | Admitting: Obstetrics

## 2015-09-29 ENCOUNTER — Ambulatory Visit: Payer: 59

## 2015-09-29 DIAGNOSIS — Z1231 Encounter for screening mammogram for malignant neoplasm of breast: Secondary | ICD-10-CM

## 2015-10-10 ENCOUNTER — Ambulatory Visit: Payer: 59

## 2015-12-28 ENCOUNTER — Ambulatory Visit
Admission: RE | Admit: 2015-12-28 | Discharge: 2015-12-28 | Disposition: A | Discharge status: Home / Self Care | Payer: BLUE CROSS/BLUE SHIELD | Source: Ambulatory Visit | Attending: Endocrinology | Admitting: Endocrinology

## 2015-12-28 DIAGNOSIS — E041 Nontoxic single thyroid nodule: Secondary | ICD-10-CM

## 2016-11-13 ENCOUNTER — Other Ambulatory Visit: Payer: Self-pay | Admitting: Endocrinology

## 2016-11-13 DIAGNOSIS — E041 Nontoxic single thyroid nodule: Secondary | ICD-10-CM

## 2016-12-26 ENCOUNTER — Ambulatory Visit
Admission: RE | Admit: 2016-12-26 | Discharge: 2016-12-26 | Disposition: A | Discharge status: Home / Self Care | Payer: Self-pay | Source: Ambulatory Visit | Attending: Endocrinology | Admitting: Endocrinology

## 2016-12-26 DIAGNOSIS — E041 Nontoxic single thyroid nodule: Secondary | ICD-10-CM

## 2017-12-16 ENCOUNTER — Other Ambulatory Visit: Payer: Self-pay | Admitting: Endocrinology

## 2017-12-16 DIAGNOSIS — E041 Nontoxic single thyroid nodule: Secondary | ICD-10-CM

## 2017-12-22 ENCOUNTER — Ambulatory Visit
Admission: RE | Admit: 2017-12-22 | Discharge: 2017-12-22 | Disposition: A | Discharge status: Home / Self Care | Payer: No Typology Code available for payment source | Source: Ambulatory Visit | Attending: Endocrinology | Admitting: Endocrinology

## 2017-12-22 DIAGNOSIS — E041 Nontoxic single thyroid nodule: Secondary | ICD-10-CM

## 2019-11-11 ENCOUNTER — Other Ambulatory Visit: Payer: Self-pay | Admitting: Family Medicine

## 2019-11-11 DIAGNOSIS — Z1231 Encounter for screening mammogram for malignant neoplasm of breast: Secondary | ICD-10-CM

## 2019-11-22 ENCOUNTER — Other Ambulatory Visit: Payer: Self-pay

## 2019-11-22 ENCOUNTER — Ambulatory Visit
Admission: RE | Admit: 2019-11-22 | Discharge: 2019-11-22 | Disposition: A | Discharge status: Home / Self Care | Payer: No Typology Code available for payment source | Source: Ambulatory Visit | Attending: Family Medicine | Admitting: Family Medicine

## 2019-11-22 DIAGNOSIS — Z1231 Encounter for screening mammogram for malignant neoplasm of breast: Secondary | ICD-10-CM

## 2019-11-24 ENCOUNTER — Other Ambulatory Visit (HOSPITAL_COMMUNITY): Payer: Self-pay

## 2019-11-24 ENCOUNTER — Other Ambulatory Visit: Payer: Self-pay | Admitting: Family Medicine

## 2019-11-24 DIAGNOSIS — R928 Other abnormal and inconclusive findings on diagnostic imaging of breast: Secondary | ICD-10-CM

## 2019-11-25 ENCOUNTER — Other Ambulatory Visit: Payer: Self-pay | Admitting: Family Medicine

## 2019-11-25 DIAGNOSIS — R928 Other abnormal and inconclusive findings on diagnostic imaging of breast: Secondary | ICD-10-CM

## 2019-12-07 ENCOUNTER — Other Ambulatory Visit: Payer: Self-pay | Admitting: Family Medicine

## 2019-12-07 ENCOUNTER — Ambulatory Visit (HOSPITAL_COMMUNITY): Payer: No Typology Code available for payment source

## 2019-12-07 ENCOUNTER — Other Ambulatory Visit: Payer: Self-pay

## 2019-12-07 ENCOUNTER — Other Ambulatory Visit: Payer: No Typology Code available for payment source

## 2019-12-07 ENCOUNTER — Ambulatory Visit
Admission: RE | Admit: 2019-12-07 | Discharge: 2019-12-07 | Disposition: A | Discharge status: Home / Self Care | Payer: No Typology Code available for payment source | Source: Ambulatory Visit | Attending: Obstetrics and Gynecology | Admitting: Obstetrics and Gynecology

## 2019-12-07 DIAGNOSIS — R928 Other abnormal and inconclusive findings on diagnostic imaging of breast: Secondary | ICD-10-CM

## 2019-12-16 ENCOUNTER — Other Ambulatory Visit: Payer: No Typology Code available for payment source

## 2019-12-20 ENCOUNTER — Other Ambulatory Visit: Payer: Self-pay | Admitting: Endocrinology

## 2019-12-20 DIAGNOSIS — E041 Nontoxic single thyroid nodule: Secondary | ICD-10-CM

## 2019-12-27 ENCOUNTER — Ambulatory Visit
Admission: RE | Admit: 2019-12-27 | Discharge: 2019-12-27 | Disposition: A | Discharge status: Home / Self Care | Payer: No Typology Code available for payment source | Source: Ambulatory Visit | Attending: Endocrinology | Admitting: Endocrinology

## 2019-12-27 DIAGNOSIS — E041 Nontoxic single thyroid nodule: Secondary | ICD-10-CM

## 2020-01-04 ENCOUNTER — Other Ambulatory Visit: Payer: No Typology Code available for payment source

## 2020-06-06 ENCOUNTER — Other Ambulatory Visit: Payer: No Typology Code available for payment source

## 2020-06-22 IMAGING — MG DIGITAL SCREENING BILAT W/ CAD
4 series · 4 of 4 positions shown · non-contrast
Comparison: Previous exam(s).
COMPARISON: Previous exam(s).

Addendum:
CLINICAL DATA: Screening.

EXAM:
DIGITAL SCREENING BILATERAL MAMMOGRAM WITH CAD

[L CC]
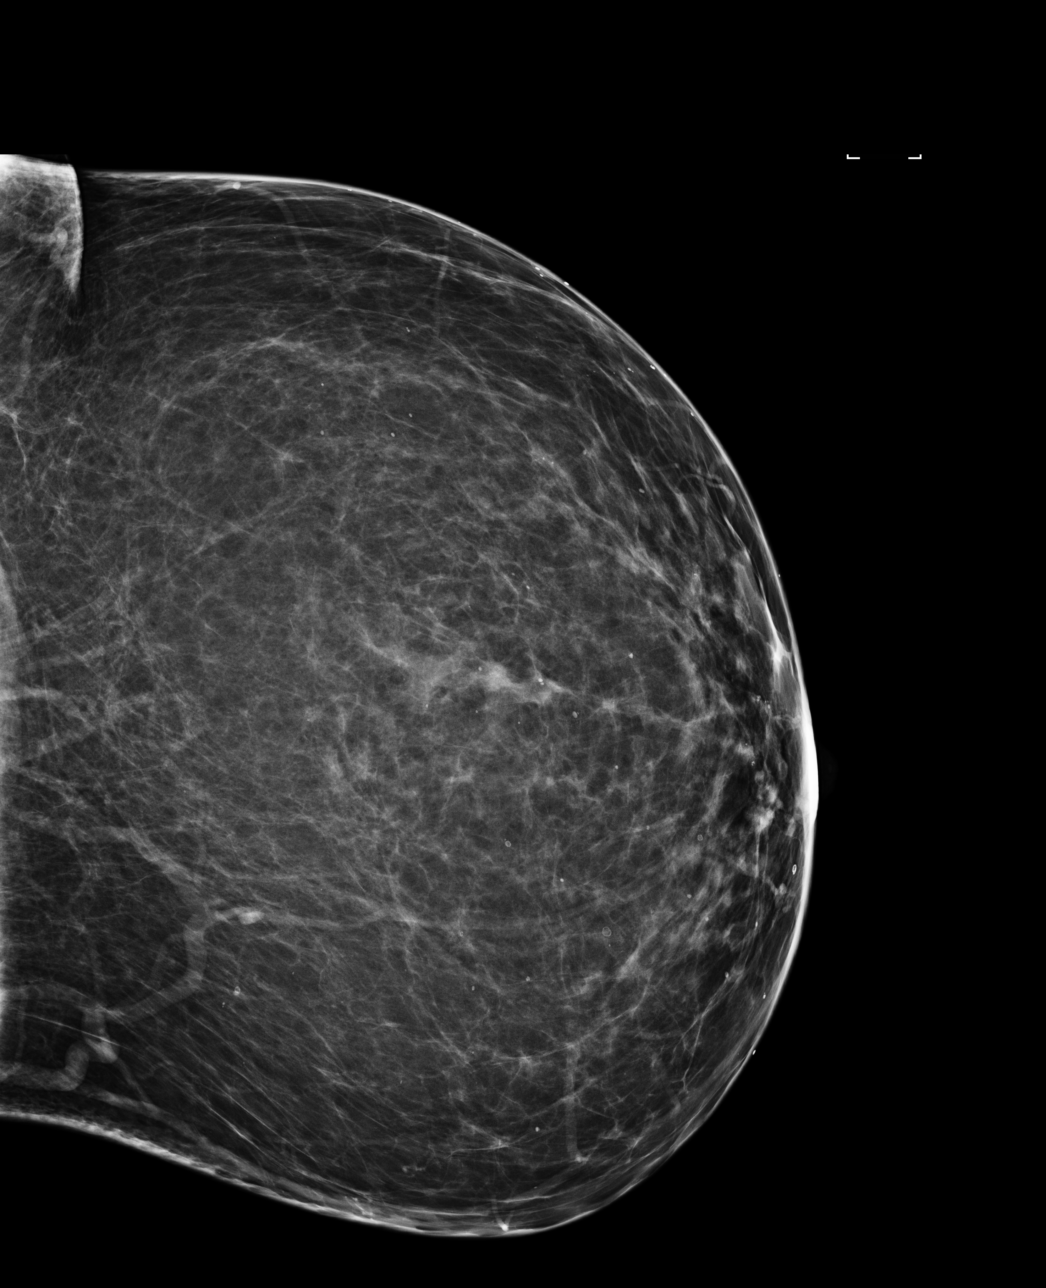

[R CC]
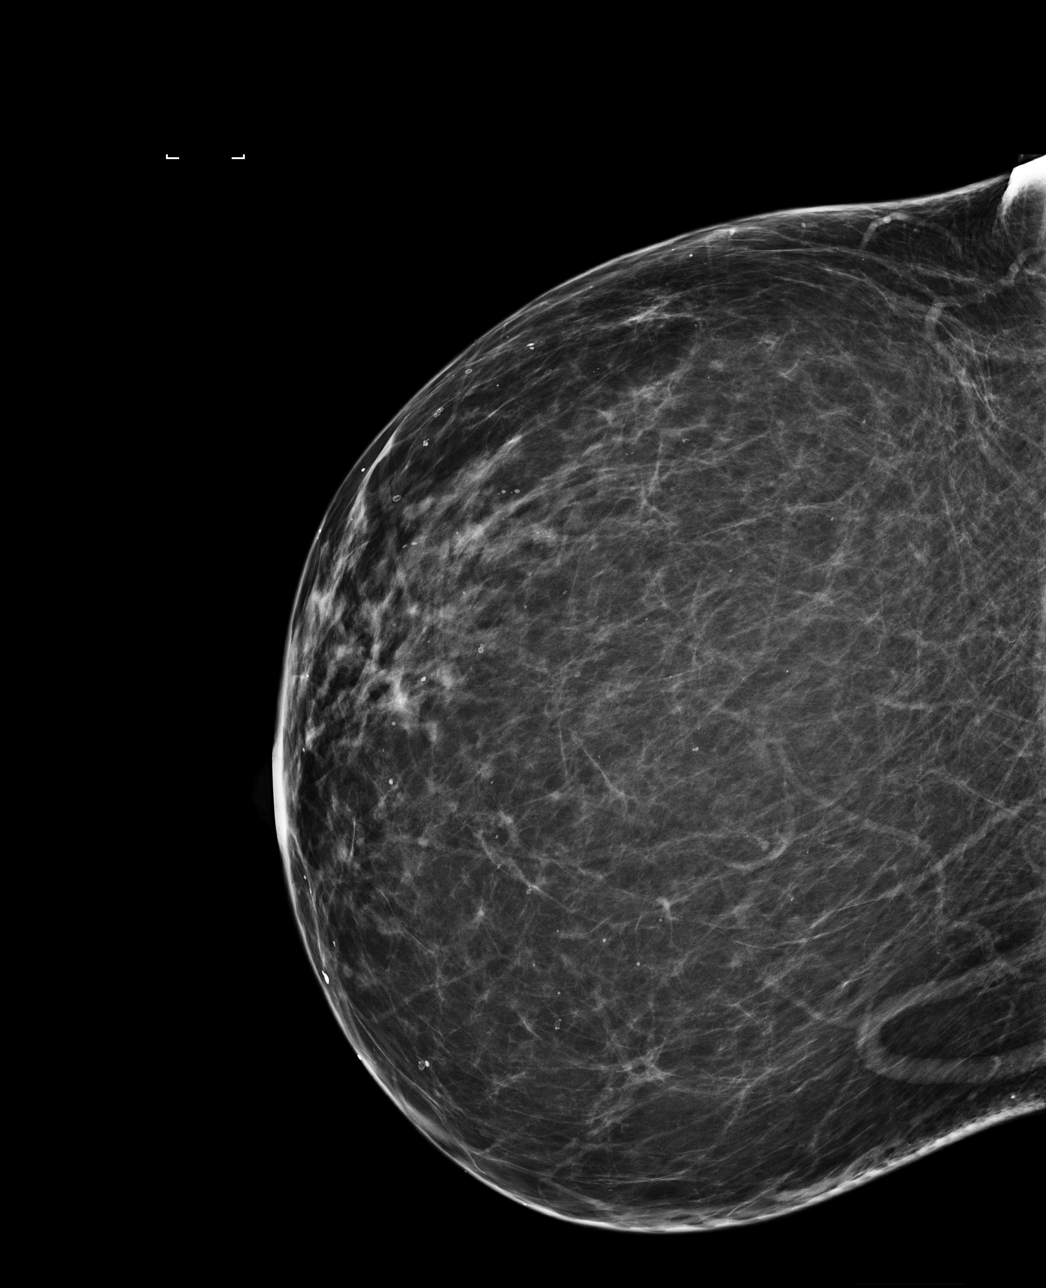

[R MLO]
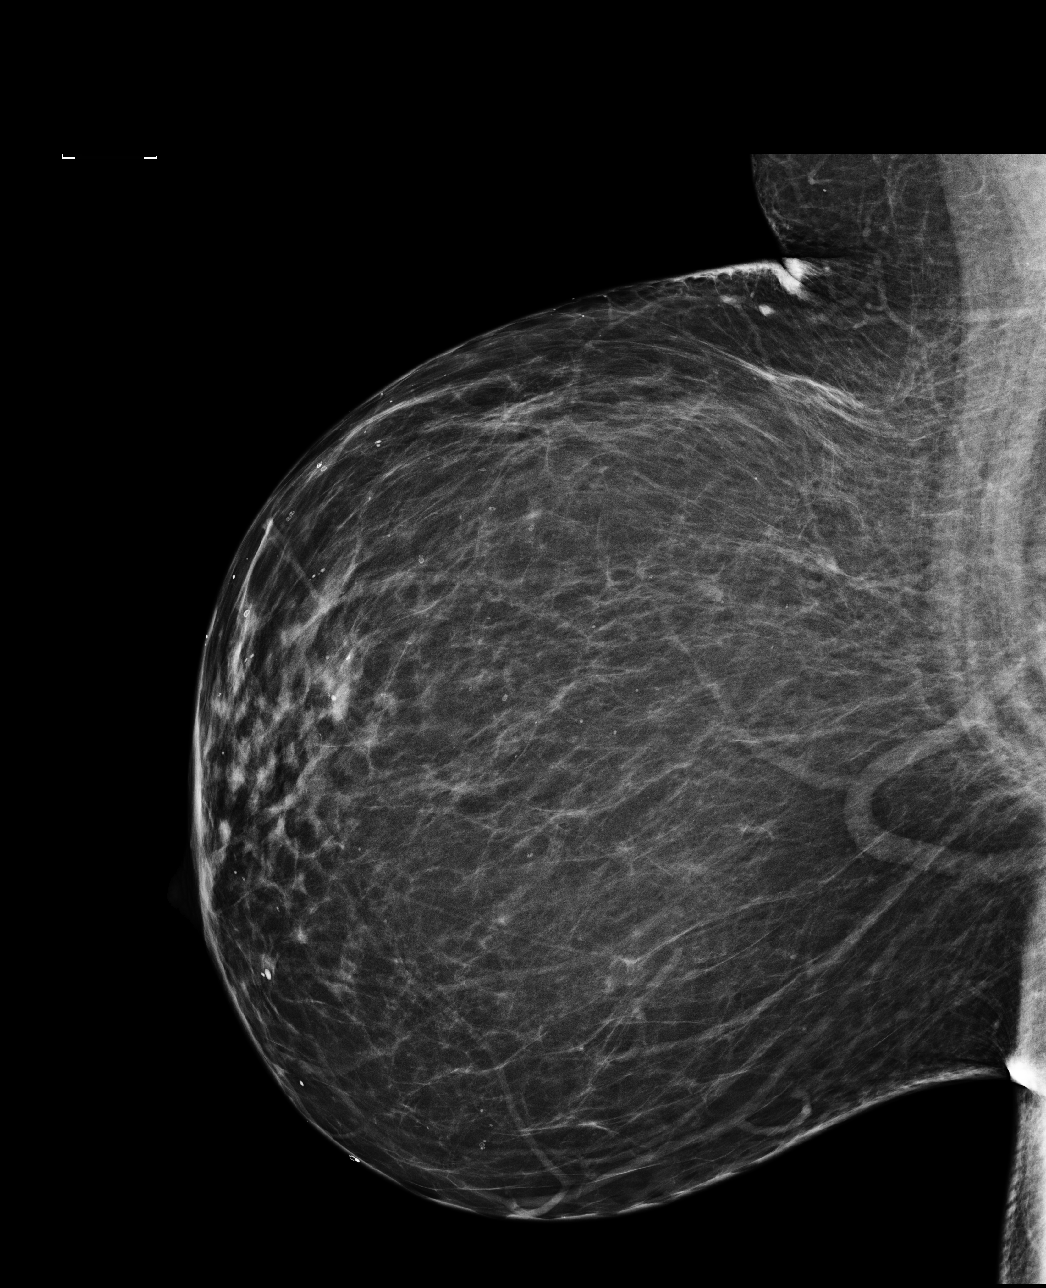

[L MLO]
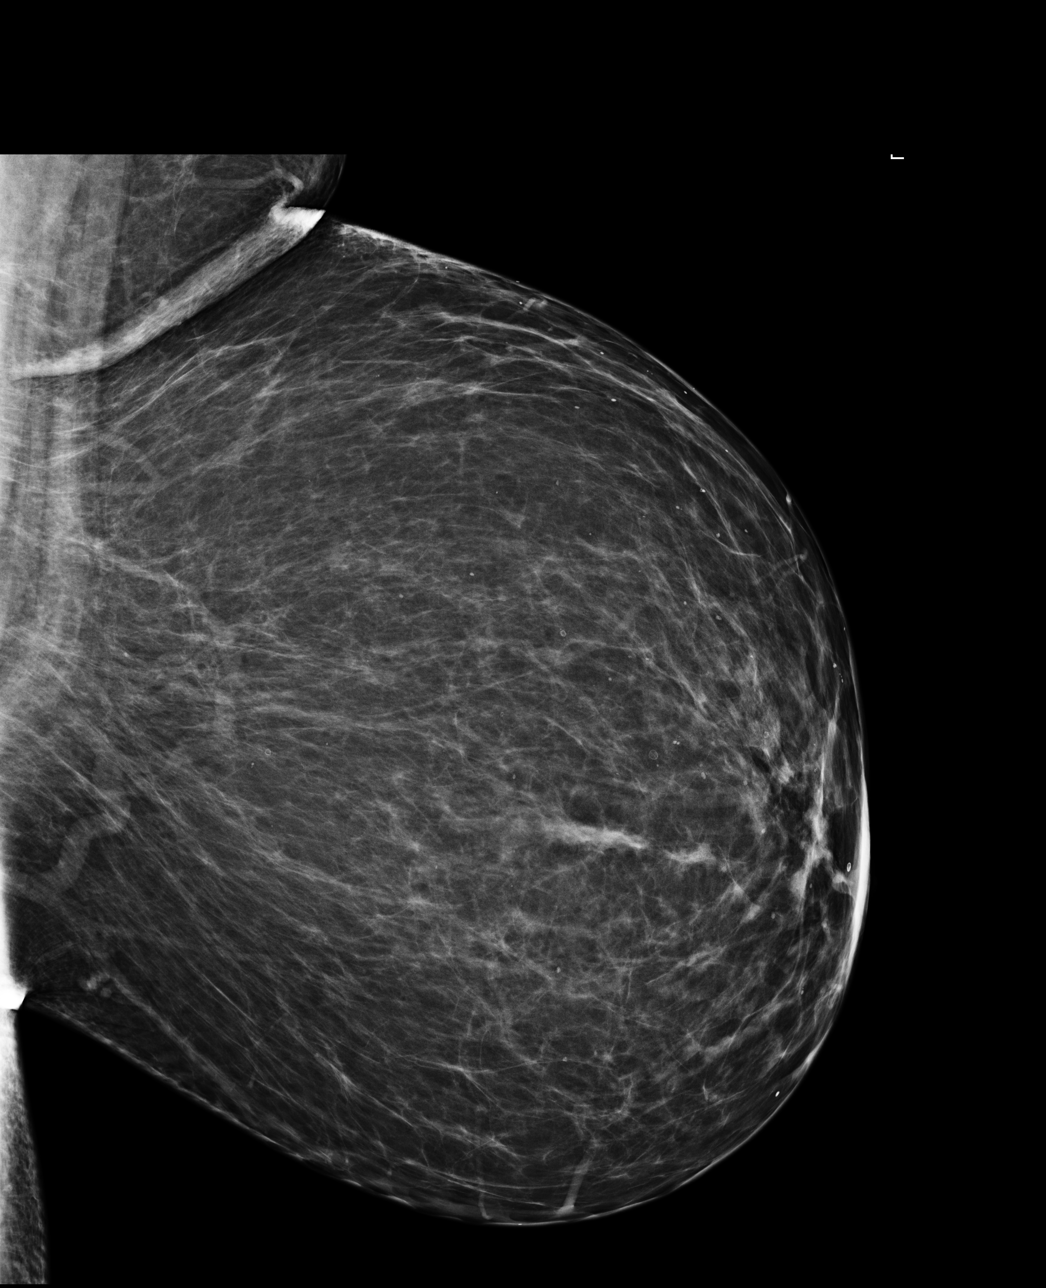

[4 of 4 positions shown; findings below may reference images not displayed]

ACR Breast Density Category b: There are scattered areas of
fibroglandular density.
FINDINGS: In the right breast, a possible asymmetry warrants further
evaluation. In the left breast, no findings suspicious for
malignancy. Images were processed with CAD.
IMPRESSION: Further evaluation is suggested for possible asymmetry in the right
breast.

RECOMMENDATION:
Diagnostic mammogram and possibly ultrasound of the right breast.
(Code:M2-R-DDD)

The patient will be contacted regarding the findings, and additional
imaging will be scheduled.

BI-RADS CATEGORY  0: Incomplete. Need additional imaging evaluation
and/or prior mammograms for comparison.
FINDINGS: In the LEFT breast, a possible asymmetry warrants further
evaluation.

In the RIGHT breast, no suspicious findings for malignancy.
IMPRESSION: Further evaluation is suggested for the possible asymmetry in the
LEFT breast.

Recommendation is for diagnostic mammogram and possible ultrasound
of the LEFT breast.

BI-RADS category 0

*** End of Addendum ***
ACR Breast Density Category b: There are scattered areas of
fibroglandular density.
FINDINGS: In the right breast, a possible asymmetry warrants further
evaluation. In the left breast, no findings suspicious for
malignancy. Images were processed with CAD.
IMPRESSION: Further evaluation is suggested for possible asymmetry in the right
breast.

RECOMMENDATION:
Diagnostic mammogram and possibly ultrasound of the right breast.
(Code:M2-R-DDD)

The patient will be contacted regarding the findings, and additional
imaging will be scheduled.

BI-RADS CATEGORY  0: Incomplete. Need additional imaging evaluation
and/or prior mammograms for comparison.

## 2020-12-04 ENCOUNTER — Ambulatory Visit: Payer: No Typology Code available for payment source | Admitting: Podiatry

## 2024-09-30 ENCOUNTER — Other Ambulatory Visit: Payer: Self-pay | Admitting: Physician Assistant

## 2024-10-01 LAB — DERMATOLOGY PATHOLOGY
